# Patient Record
Sex: Male | Born: 1950 | Race: White | Hispanic: No | Marital: Married
Health system: Southern US, Community
[De-identification: ages and names within clinical notes are randomized; demographics above are authoritative.]

---

## 2008-12-29 ENCOUNTER — Ambulatory Visit: Payer: Self-pay | Admitting: Unknown Physician Specialty

## 2009-01-08 ENCOUNTER — Inpatient Hospital Stay: Payer: Self-pay | Admitting: Unknown Physician Specialty

## 2011-08-31 ENCOUNTER — Ambulatory Visit: Payer: Self-pay | Admitting: Family Medicine

## 2011-09-19 ENCOUNTER — Ambulatory Visit: Payer: Self-pay | Admitting: Oncology

## 2011-09-23 ENCOUNTER — Ambulatory Visit: Payer: Self-pay | Admitting: Oncology

## 2011-09-26 ENCOUNTER — Ambulatory Visit: Payer: Self-pay | Admitting: Oncology

## 2011-10-27 ENCOUNTER — Ambulatory Visit: Payer: Self-pay | Admitting: Oncology

## 2011-10-31 ENCOUNTER — Ambulatory Visit: Payer: Self-pay | Admitting: Unknown Physician Specialty

## 2011-11-11 ENCOUNTER — Other Ambulatory Visit: Payer: Self-pay | Admitting: Gastroenterology

## 2011-11-11 ENCOUNTER — Other Ambulatory Visit: Payer: Self-pay | Admitting: Unknown Physician Specialty

## 2011-11-11 DIAGNOSIS — D500 Iron deficiency anemia secondary to blood loss (chronic): Secondary | ICD-10-CM

## 2011-11-11 DIAGNOSIS — K5730 Diverticulosis of large intestine without perforation or abscess without bleeding: Secondary | ICD-10-CM

## 2011-11-23 ENCOUNTER — Other Ambulatory Visit: Payer: Self-pay

## 2011-11-23 ENCOUNTER — Ambulatory Visit: Payer: Self-pay | Admitting: Oncology

## 2011-11-26 ENCOUNTER — Ambulatory Visit: Payer: Self-pay | Admitting: Oncology

## 2011-11-30 ENCOUNTER — Ambulatory Visit: Payer: Self-pay | Admitting: Neurology

## 2011-12-08 ENCOUNTER — Ambulatory Visit
Admission: RE | Admit: 2011-12-08 | Discharge: 2011-12-08 | Disposition: A | Discharge status: Home / Self Care | Payer: Medicare Other | Source: Ambulatory Visit | Attending: Gastroenterology | Admitting: Gastroenterology

## 2011-12-08 DIAGNOSIS — D500 Iron deficiency anemia secondary to blood loss (chronic): Secondary | ICD-10-CM

## 2011-12-08 DIAGNOSIS — K5730 Diverticulosis of large intestine without perforation or abscess without bleeding: Secondary | ICD-10-CM

## 2011-12-27 ENCOUNTER — Ambulatory Visit: Payer: Self-pay | Admitting: Oncology

## 2012-02-29 ENCOUNTER — Ambulatory Visit: Payer: Self-pay | Admitting: Oncology

## 2012-02-29 LAB — CBC CANCER CENTER
Basophil #: 0.1 x10 3/mm (ref 0.0–0.1)
Lymphocyte %: 49.3 %
MCH: 35.1 pg — ABNORMAL HIGH (ref 26.0–34.0)
MCHC: 33.8 g/dL (ref 32.0–36.0)
MCV: 103.8 fL — ABNORMAL HIGH (ref 80–100)
Monocyte %: 13.3 %
Neutrophil #: 1.8 x10 3/mm (ref 1.4–6.5)
Neutrophil %: 32.5 %
Platelet: 196 x10 3/mm (ref 150–440)
RBC: 4.47 10*6/uL (ref 4.40–5.90)

## 2012-02-29 LAB — FERRITIN: Ferritin (ARMC): 58 ng/mL (ref 8–388)

## 2012-02-29 LAB — IRON AND TIBC
Iron Bind.Cap.(Total): 467 ug/dL — ABNORMAL HIGH (ref 250–450)
Iron Saturation: 22 %
Unbound Iron-Bind.Cap.: 363 ug/dL

## 2012-03-26 ENCOUNTER — Ambulatory Visit: Payer: Self-pay | Admitting: Oncology

## 2012-04-23 ENCOUNTER — Ambulatory Visit: Payer: Self-pay | Admitting: Otolaryngology

## 2012-04-23 LAB — CREATININE, SERUM
Creatinine: 0.78 mg/dL (ref 0.60–1.30)
EGFR (African American): >60

## 2012-04-25 ENCOUNTER — Ambulatory Visit: Payer: Self-pay | Admitting: Oncology

## 2012-04-26 ENCOUNTER — Ambulatory Visit: Payer: Self-pay | Admitting: Otolaryngology

## 2012-04-26 LAB — BASIC METABOLIC PANEL
BUN: 9 mg/dL (ref 7–18)
Calcium, Total: 8.4 mg/dL — ABNORMAL LOW (ref 8.5–10.1)
Chloride: 98 mmol/L (ref 98–107)
Co2: 25 mmol/L (ref 21–32)
Creatinine: 0.66 mg/dL (ref 0.60–1.30)
EGFR (African American): >60
Glucose: 100 mg/dL — ABNORMAL HIGH (ref 65–99)
Osmolality: 256 (ref 275–301)
Potassium: 4.4 mmol/L (ref 3.5–5.1)
Sodium: 128 mmol/L — ABNORMAL LOW (ref 136–145)

## 2012-04-26 LAB — CBC
HCT: 46 % (ref 40.0–52.0)
HGB: 15.9 g/dL (ref 13.0–18.0)
MCH: 34.3 pg — ABNORMAL HIGH (ref 26.0–34.0)
MCHC: 34.6 g/dL (ref 32.0–36.0)
RDW: 13.5 % (ref 11.5–14.5)
WBC: 8.2 10*3/uL (ref 3.8–10.6)

## 2012-04-30 ENCOUNTER — Ambulatory Visit: Payer: Self-pay | Admitting: Otolaryngology

## 2012-05-03 LAB — PATHOLOGY REPORT

## 2012-05-26 ENCOUNTER — Ambulatory Visit: Payer: Self-pay | Admitting: Oncology

## 2012-06-03 ENCOUNTER — Emergency Department: Payer: Self-pay | Admitting: Emergency Medicine

## 2012-09-05 ENCOUNTER — Ambulatory Visit: Payer: Self-pay | Admitting: Oncology

## 2012-09-05 LAB — CBC CANCER CENTER
Basophil #: 0 x10 3/mm (ref 0.0–0.1)
Eosinophil %: 5.7 %
HGB: 14.9 g/dL (ref 13.0–18.0)
Lymphocyte %: 45.3 %
Monocyte %: 12.5 %
Neutrophil %: 35.9 %
RBC: 4.39 10*6/uL — ABNORMAL LOW (ref 4.40–5.90)
WBC: 6.9 x10 3/mm (ref 3.8–10.6)

## 2012-09-05 LAB — IRON AND TIBC
Iron Bind.Cap.(Total): 464 ug/dL — ABNORMAL HIGH (ref 250–450)
Unbound Iron-Bind.Cap.: 342 ug/dL

## 2012-09-05 LAB — FERRITIN: Ferritin (ARMC): 47 ng/mL (ref 8–388)

## 2012-09-12 LAB — CBC CANCER CENTER
Basophil #: 0 x10 3/mm (ref 0.0–0.1)
Eosinophil #: 0.4 x10 3/mm (ref 0.0–0.7)
HGB: 14.2 g/dL (ref 13.0–18.0)
Lymphocyte %: 42.8 %
MCH: 34.1 pg — ABNORMAL HIGH (ref 26.0–34.0)
MCHC: 34.4 g/dL (ref 32.0–36.0)
MCV: 99 fL (ref 80–100)
Monocyte #: 0.7 x10 3/mm (ref 0.2–1.0)
Neutrophil %: 36 %
Platelet: 237 x10 3/mm (ref 150–440)
RBC: 4.16 10*6/uL — ABNORMAL LOW (ref 4.40–5.90)
RDW: 13.4 % (ref 11.5–14.5)
WBC: 5.3 x10 3/mm (ref 3.8–10.6)

## 2012-09-12 LAB — IRON AND TIBC
Iron Bind.Cap.(Total): 442 ug/dL (ref 250–450)
Iron Saturation: 17 %
Iron: 77 ug/dL (ref 65–175)
Unbound Iron-Bind.Cap.: 365 ug/dL

## 2012-09-12 LAB — FERRITIN: Ferritin (ARMC): 40 ng/mL (ref 8–388)

## 2012-09-25 ENCOUNTER — Ambulatory Visit: Payer: Self-pay | Admitting: Oncology

## 2013-01-08 IMAGING — CT CT NECK WITH CONTRAST
2 series · 10 of 14 positions shown, 12 images · non-contrast
Comparison: none

REASON FOR EXAM: [HOSPITAL] LABS in [HOSPITAL] Confirmed squamous cell carcinoma in
anterior right Jilani Justin...
COMMENTS:

[Series 2: soft tissue · axial · 0.51mm/px · z∈[-251,-14]mm · 8 of 103 slices shown, 10 images]
[im 12/103  soft-tissue]
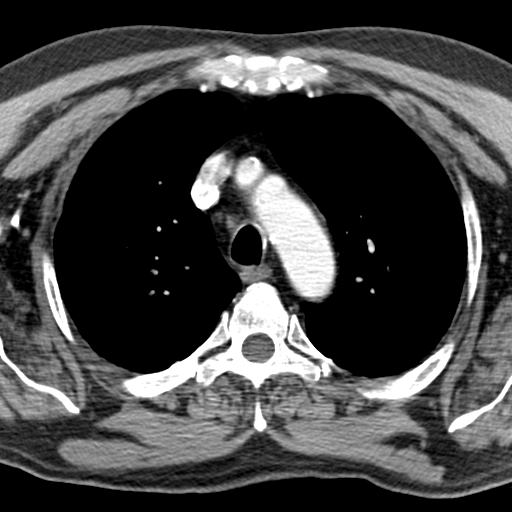
[im 12/103  bone]
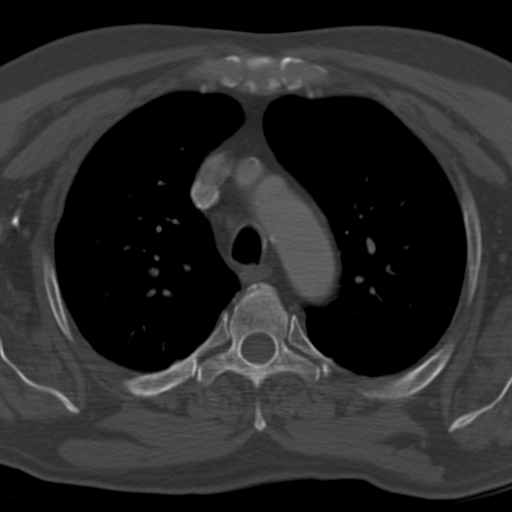
[im 23/103  bone]
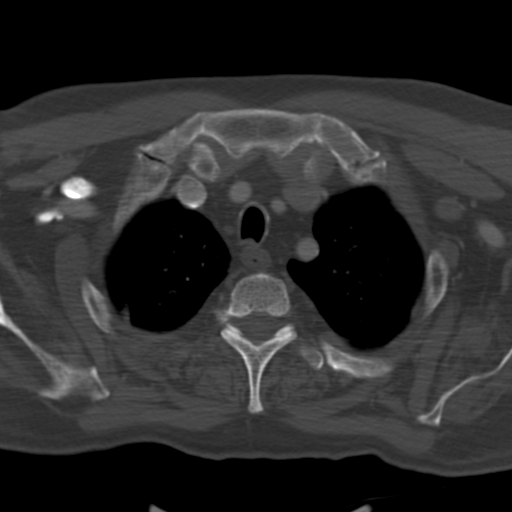
[im 35/103  bone]
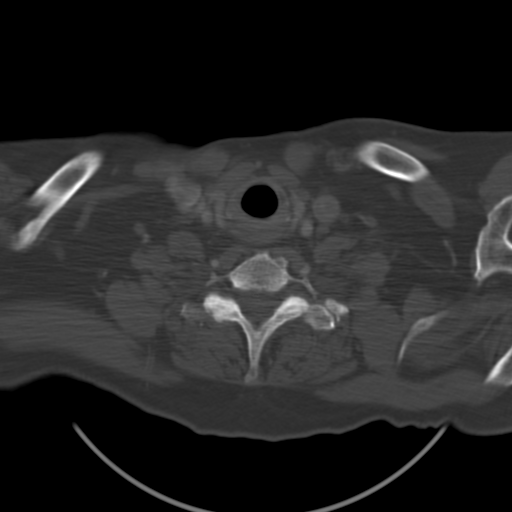
[im 46/103  bone]
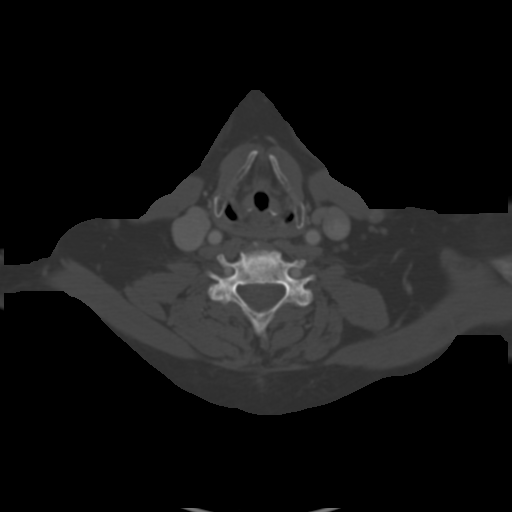
[im 57/103  soft-tissue]
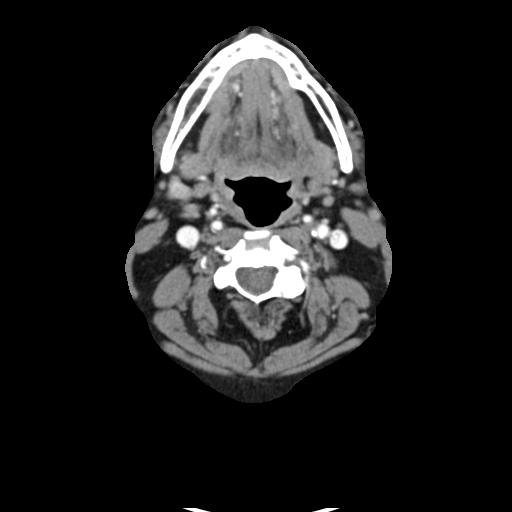
[im 57/103  bone]
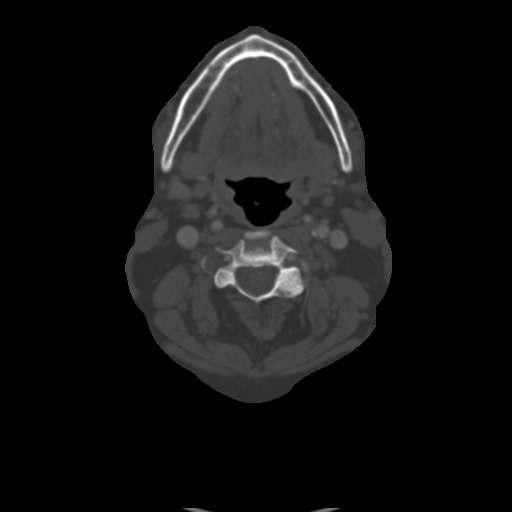
[im 69/103  bone]
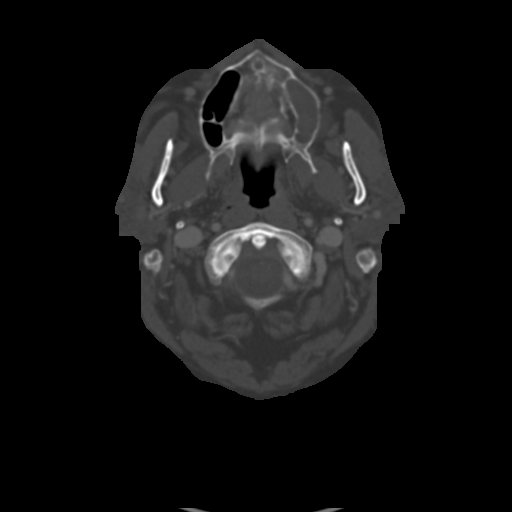
[im 80/103  bone]
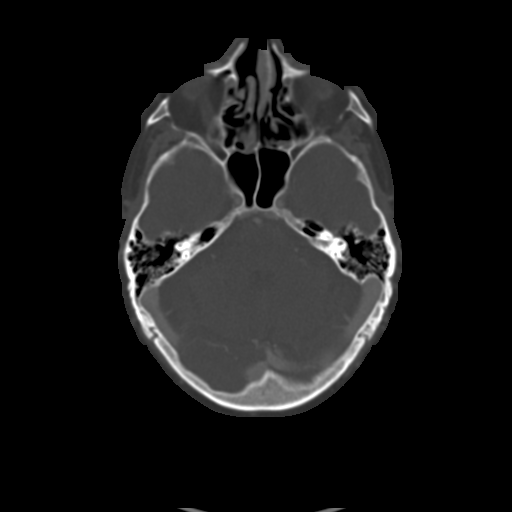
[im 91/103  bone]
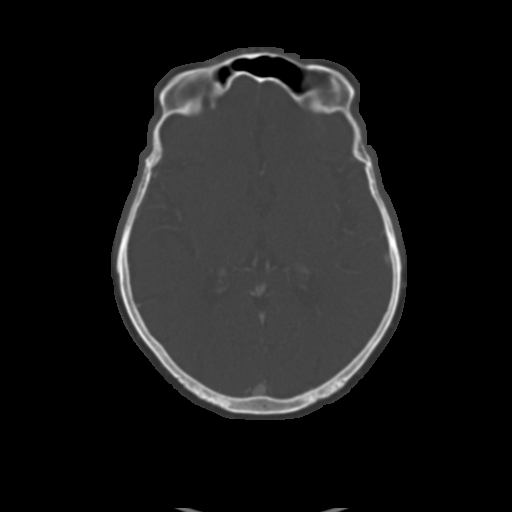

[Series 3: lung · axial · 0.57mm/px · z∈[-251,-215]mm · 2 of 36 slices shown]
[im 12/36  bone]
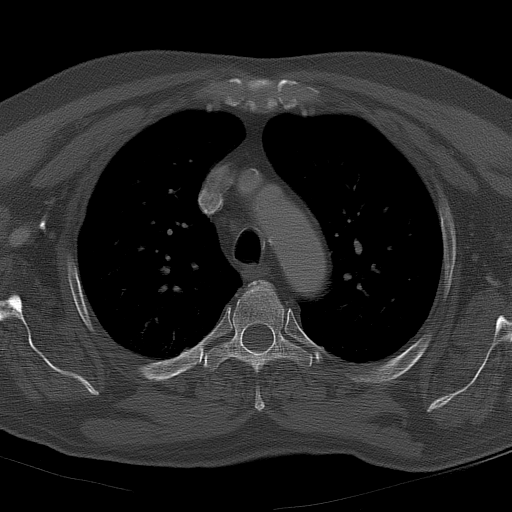
[im 24/36  bone]
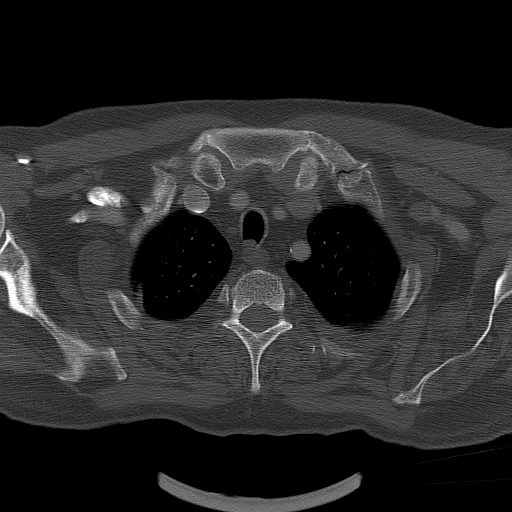

[10 of 14 positions shown; findings below may reference images not displayed]

PROCEDURE:     TEICHROEB - TEICHROEB NECK WITH CONTRAST  - April 23, 2012  [DATE]

RESULT:     Axial CT scanning was performed through the neck with
reconstructions at 3 mm intervals and slice thicknesses. The patient
received 75 cc of Ssovue-GEE. Review of multiplanar reconstructed images was
performed separately on the VIA monitor.

The structures of the floor of the mouth are grossly normal. I see no masses
within the substance of the tongue. The submandibular glands appear normal
in density and contour and symmetric in size. The parotid glands also of
normal in appearance. I do not see bulky cervical lymph nodes. There is soft
tissue density material in the inferior aspect of the left maxillary sinus.
The nasal septum is deviated toward the left. The nasal passages appear
patent. The tonsils and adenoids are normal. The prevertebral soft tissue
spaces are normal in appearance. The laryngeal structures exhibit no acute
abnormality. The thyroid lobes are small but symmetric from right to left.
The jugular and carotid vessels are normally positioned. I do not see
pathologic sized cervical lymph nodes. The cervical vertebral bodies are
preserved in height. There is loss of the normal cervical lordosis. There is
disc space narrowing at C5-C6 and C6-C7.
IMPRESSION: 1. I do not see CT evidence of abnormalities of the floor of the mouth.
Further evaluation with PET/CT scanning may be the most useful next step.
2. I do not see bulky anterior or posterior cervical lymphadenopathy.
3. The submandibular glands exhibit no acute abnormality.
4. There is soft tissue density in the inferior aspect of the left maxillary
sinus which is likely chronic.
5. There are degenerative changes of the mid cervical discs.

Dictation location one

## 2013-03-19 ENCOUNTER — Ambulatory Visit: Payer: Self-pay | Admitting: Otolaryngology

## 2013-03-19 LAB — CBC WITH DIFFERENTIAL/PLATELET
Eosinophil %: 3.4 %
HCT: 40.6 % (ref 40.0–52.0)
HGB: 13.3 g/dL (ref 13.0–18.0)
Lymphocyte #: 3.4 10*3/uL (ref 1.0–3.6)
Lymphocyte %: 30.2 %
MCH: 30.5 pg (ref 26.0–34.0)
MCHC: 32.8 g/dL (ref 32.0–36.0)
MCV: 93 fL (ref 80–100)
Monocyte #: 1.5 x10 3/mm — ABNORMAL HIGH (ref 0.2–1.0)
Monocyte %: 13.5 %
Neutrophil #: 5.9 10*3/uL (ref 1.4–6.5)
Neutrophil %: 51.5 %
Platelet: 251 10*3/uL (ref 150–440)
RBC: 4.37 10*6/uL — ABNORMAL LOW (ref 4.40–5.90)
RDW: 16.2 % — ABNORMAL HIGH (ref 11.5–14.5)

## 2013-03-19 LAB — BASIC METABOLIC PANEL
BUN: 6 mg/dL — ABNORMAL LOW (ref 7–18)
Calcium, Total: 8.5 mg/dL (ref 8.5–10.1)
Chloride: 96 mmol/L — ABNORMAL LOW (ref 98–107)
Co2: 23 mmol/L (ref 21–32)
EGFR (Non-African Amer.): >60
Osmolality: 259 (ref 275–301)
Potassium: 4 mmol/L (ref 3.5–5.1)

## 2013-03-19 LAB — TSH: Thyroid Stimulating Horm: 4.68 u[IU]/mL — ABNORMAL HIGH

## 2013-07-30 ENCOUNTER — Ambulatory Visit: Payer: Self-pay | Admitting: Otolaryngology

## 2013-07-30 LAB — TSH: Thyroid Stimulating Horm: 3.37 u[IU]/mL

## 2013-11-27 ENCOUNTER — Ambulatory Visit: Payer: Self-pay | Admitting: Otolaryngology

## 2013-11-27 LAB — T4, FREE: Free Thyroxine: 1.07 ng/dL (ref 0.76–1.46)

## 2014-02-25 ENCOUNTER — Ambulatory Visit: Payer: Self-pay | Admitting: Otolaryngology

## 2014-02-25 LAB — T4, FREE: Free Thyroxine: 1.07 ng/dL (ref 0.76–1.46)

## 2014-02-25 LAB — TSH: Thyroid Stimulating Horm: 1.87 u[IU]/mL

## 2014-09-09 ENCOUNTER — Ambulatory Visit: Payer: Self-pay | Admitting: Otolaryngology

## 2014-09-09 LAB — T4, FREE: FREE THYROXINE: 0.95 ng/dL (ref 0.76–1.46)

## 2014-09-09 LAB — TSH: Thyroid Stimulating Horm: 1.73 u[IU]/mL

## 2014-09-12 ENCOUNTER — Emergency Department: Payer: Self-pay | Admitting: Emergency Medicine

## 2014-09-12 LAB — CBC
HCT: 22.4 % — AB (ref 40.0–52.0)
HGB: 7.2 g/dL — AB (ref 13.0–18.0)
MCH: 30.7 pg (ref 26.0–34.0)
MCHC: 31.9 g/dL — ABNORMAL LOW (ref 32.0–36.0)
MCV: 96 fL (ref 80–100)
Platelet: 117 10*3/uL — ABNORMAL LOW (ref 150–440)
RBC: 2.33 10*6/uL — ABNORMAL LOW (ref 4.40–5.90)
RDW: 18.1 % — AB (ref 11.5–14.5)
WBC: 9.3 10*3/uL (ref 3.8–10.6)

## 2014-09-12 LAB — LIPASE, BLOOD: LIPASE: 250 U/L (ref 73–393)

## 2014-09-12 LAB — BASIC METABOLIC PANEL
Anion Gap: 12 (ref 7–16)
BUN: 61 mg/dL — ABNORMAL HIGH (ref 7–18)
Calcium, Total: 7.4 mg/dL — ABNORMAL LOW (ref 8.5–10.1)
Chloride: 100 mmol/L (ref 98–107)
Co2: 16 mmol/L — ABNORMAL LOW (ref 21–32)
Creatinine: 3.63 mg/dL — ABNORMAL HIGH (ref 0.60–1.30)
EGFR (African American): 19 — ABNORMAL LOW
EGFR (Non-African Amer.): 17 — ABNORMAL LOW
Glucose: 105 mg/dL — ABNORMAL HIGH (ref 65–99)
Osmolality: 275 (ref 275–301)
Potassium: 3.8 mmol/L (ref 3.5–5.1)
Sodium: 128 mmol/L — ABNORMAL LOW (ref 136–145)

## 2014-09-12 LAB — COMPREHENSIVE METABOLIC PANEL
ALK PHOS: 158 U/L — AB
AST: 160 U/L — AB (ref 15–37)
Albumin: 0.7 g/dL — ABNORMAL LOW (ref 3.4–5.0)
Anion Gap: 3 — ABNORMAL LOW (ref 7–16)
BUN: 43 mg/dL — AB (ref 7–18)
Bilirubin,Total: 12.9 mg/dL — ABNORMAL HIGH (ref 0.2–1.0)
CHLORIDE: 120 mmol/L — AB (ref 98–107)
CO2: 12 mmol/L — AB (ref 21–32)
Calcium, Total: <5 mg/dL — CL (ref 8.5–10.1)
Creatinine: 2.27 mg/dL — ABNORMAL HIGH (ref 0.60–1.30)
GFR CALC AF AMER: 34 — AB
GFR CALC NON AF AMER: 29 — AB
GLUCOSE: 67 mg/dL (ref 65–99)
OSMOLALITY: 279 (ref 275–301)
Potassium: 2.3 mmol/L — CL (ref 3.5–5.1)
SGPT (ALT): 60 U/L
SODIUM: 135 mmol/L — AB (ref 136–145)
TOTAL PROTEIN: 4.4 g/dL — AB (ref 6.4–8.2)

## 2014-09-12 LAB — PROTIME-INR
INR: 4.2
INR: 2.2
PROTHROMBIN TIME: 39.3 s — AB (ref 11.5–14.7)
PROTHROMBIN TIME: 23.6 s — AB (ref 11.5–14.7)

## 2014-09-12 LAB — HEMOGLOBIN: HGB: 8.9 g/dL — AB (ref 13.0–18.0)

## 2014-09-12 LAB — TROPONIN I: TROPONIN-I: 0.02 ng/mL

## 2014-09-12 LAB — BILIRUBIN, DIRECT: Bilirubin, Direct: 10.2 mg/dL — ABNORMAL HIGH (ref 0.00–0.20)

## 2014-09-25 DEATH — deceased

## 2015-04-19 NOTE — Op Note (Signed)
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    OPERATIVE PROCEDURES:  1. Excision squamous cell carcinoma right anterior floor of mouth with frozen section margins.  2. Right suprahyoid modified neck dissection.  SURGEON: Cammy CopaPaul H. Shabazz Mckey, MD  ASSISTANT: Bud Facereighton Vaught, M.D.   ANESTHESIA: General.  COMPLICATIONS: None.   TOTAL ESTIMATED BLOOD LOSS: 15 mL.   DESCRIPTION OF PROCEDURE: Patient was given general anesthesia by nasotracheal intubation through the left nostril to free up the oropharynx. The oropharynx was visualized and area was marked around the lesion in the anterior floor of mouth. The skin was marked on the anterior neck for making incision for the modified neck dissection. The skin was infiltrated with 6 mL of 1% Xylocaine with epinephrine 1:100,000 for vasoconstriction. The right anterior floor of mouth, base of tongue was infiltrated also for further vasoconstriction. He was prepped and draped in sterile fashion.   A Davis mouth gag was used for visualizing the oropharynx. A wet vag pack was placed into the posterior pharynx because there was a small leak from the nasotracheal tube. The right floor of mouth was visualized and an incision was made sharply around the lesion leaving a couple of millimeters margin all the way around this. Before I removed it I took a separate sliver of mucosa posteriorly and anteriorly to this area and sent that for frozen sections to make sure that the margins were clear. The lesion was then freed up using electrocautery. The entire Warthin's duct on the right side was freed up and removed. Lingual nerve was found underneath this as I tracked down the floor of mouth. The entire sublingual gland was freed up  and removed as well. Once this was freed all the way around it was left sitting here and attention was directed inferiorly.   Incision was made in the neck a couple of fingerbreadths below the mandibular margin. This was carried through skin and subcutaneous down to the level of platysma and platysma was then divided being careful not to catch the marginal mandibular branch of the facial nerve. Dissection was carried down to the bottom of the submandibular gland and it went up underneath it until I got behind the tendon of the digastric. The submandibular gland was quite large. Once I dissected all the way around it, I started dissecting anterior to it into the mental area. There were a couple of lymph nodes there and in the submandibular triangle. These were freed up and separated from the underlying muscular tissue. The marginal mandibular branch was found and freed up to slide upward and the remaining fat and some lymph nodes removed from the submandibular triangle anteriorly. This left the gland and fatty tissue with lymph nodes attached to it attached to the submandibular duct. Then using bimanual palpation as well as bimanual visualization the rest of the nerve was freed and the cancer was delivered out the neck. There is a Y to the lingual nerve and one branch of the Y was cut as it was attached to some of the tissues attached to the cancer. This was sutured back together using a 4-0 Vicryl. There was no significant bleeding at the anterior floor of mouth and its margins were clear by frozen section. No further nodes noted in  the submandibular triangle and this was completely removed. It was copiously irrigated. No further bleeding was found. The oropharyngeal portion of the lesion was closed first lateralized in the undersurface of the tongue by the wall using 4-0 Vicryl in an interrupted fashion for closure of this area. A 10 French TLS drain was placed inferiorly. The bottom was copiously irrigated  again. There were some deep stitches that were placed to help close the dead space and then the platysmal layer was closed using 4-0 Vicryl followed by closure of the dermis using 4-0 Vicryl as was well. 5-0 nylon suture was used for closure of the skin edge. Patient tolerated procedure well and was taken to the recovery room in satisfactory condition. The wound had been covered with bacitracin, Telfa and Tegaderm. The drain was placed to low continuous suction by Vacutainer. There were no operative complications.  ____________________________ Cammy Copa, MD phj:cms D: 04/30/2012 22:59:21 ET T: 05/01/2012 10:37:49 ET JOB#: [id number]  cc: Cammy Copa, MD, <Dictator> Cammy Copa MD ELECTRONICALLY SIGNED 05/03/2012 12:30

## 2015-04-19 NOTE — Consult Note (Signed)
Reason for Visit: This 64 year old Male patient presents to the clinic for initial evaluation of  Squamous carcinoma the floor the mouth .   Referred by Dr. Genevive Bi.  Diagnosis:   Chief Complaint/Diagnosis   probable T1, N0, M0 stage I squamous cell carcinoma the floor of the mouth in 64 year old male   Pathology Report Pathology report reviewed    Imaging Report CT scan of head and neck reviewed    Referral Report Clinical notes reviewed    Planned Treatment Regimen Surgical excision    HPI   patient is a pleasant 64 year old male followed in the mid in clinic for iron deficiency anemia by Dr. Orlie Dakin. He is been followed by Dr. 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 He had a CT scan which shows no evidence of adenopathy in the floor of the mouth or neck. He is having no head and neck pain and no dysphagia. Dr. Genevive Bi would like to surgical remove this lesion and has asked me to evaluate the patient for my opinion.  Past Hx:    Arthritis:    L inguinal hernia:    Hypertension:    Left humeral fracture with graft and plate:    Neck Fusion: 1990  Past, Family and Social History:   Past Medical History positive    Cardiovascular hypertension    Past Surgical History Neck fusion, left humeral fracture with graft and plate left knee replacement    Past Medical History Comments Left inguinal hernia, arthritis, history of syncope    Family History positive    Family History Comments Mother with brain cancer    Social History positive    Social History Comments Greater than 50 pack year smoking history social EtOH use history    Additional Past Medical and Surgical History Accompanied by his wife today   Allergies:   Codeine: Rash  Home Meds:  Home  Medications: Medication Instructions Status  tylenol 650-1000mg  every 4 hours if needed  Active  Advil 200 mg oral tablet 2 tab(s) orally once a day, As Needed for headaches Active  losartan 50 mg oral tablet 1 tab(s) orally once a day Active  metoprolol succinate 100 mg oral tablet, extended release 1 tab(s) orally once a day Active  primidone 50 mg oral tablet 1 tab(s) orally 2 times a day Active  Slow Release Iron 1 tab(s) orally once a day Active  omeprazole 40 mg oral delayed release capsule 1 cap(s) orally once a day Active   Review of Systems:   General negative    Performance Status (ECOG) 0    Skin negative    Breast negative    Ophthalmologic negative    ENMT see HPI    Respiratory and Thorax negative    Cardiovascular see HPI    Gastrointestinal negative    Genitourinary negative    Musculoskeletal negative    Neurological negative    Psychiatric negative    Hematology/Lymphatics negative    Endocrine negative    Allergic/Immunologic negative   Nursing Notes:  Nursing Vital Signs and Chemo Nursing Nursing Notes: *CC Vital Signs Flowsheet:   01-May-13 10:44   Temp Temperature 98.4   Pulse Pulse 67   Respirations Respirations 20   SBP SBP 137   Pain Scale (0-10)  1  Current Weight (kg) (kg) 86.1   Height (cm) centimeters 166   BSA (m2) 1.9   Physical Exam:  General/Skin/HEENT:   General normal    Skin normal    Eyes normal    Additional PE Well-developed slightly obese male in NAD. Oral cavity shows small nodular lesion right of midline in the floor of the mouth. Remainder of oral cavity is clear. Indirect mirror examination shows cords approximating well vallecula and base of tongue within normal limits. Neck is clear without evidence of some digastric cervical or supraclavicular adenopathy. Lungs are clear to A&P cardiac examination shows regular rate and rhythm.   Breasts/Resp/CV/GI/GU:   Respiratory and Thorax normal     Cardiovascular normal    Gastrointestinal normal    Genitourinary normal   MS/Neuro/Psych/Lymph:   Musculoskeletal normal    Neurological normal    Lymphatics normal   Other Results:  Radiology Results: CT:    29-Apr-13 14:03, CT Neck With Contrast   CT Neck With Contrast    REASON FOR EXAM:    [facility] in [place] Confirmed squamous cell carcinoma   in anterior right floo...  COMMENTS:       PROCEDURE: MCT - MCT NECK WITH CONTRAST  - Apr 23 2012  2:03PM     RESULT: Axial CT scanning was performed through the neck with   reconstructions at 3 mm intervals and slice thicknesses. The patient   received 75 cc of Isovue-300. Review of multiplanar reconstructed images   was performed separately on the VIA monitor.    The structures of the floor of the mouth are grossly normal. I see no   masses within the substance of the tongue. The submandibular glands   appear normal in density and contour and symmetric in size. The parotid   glands also of normal in appearance. I do not see bulky cervical lymph     nodes. There is soft tissue density material in the inferior aspect of   the left maxillary sinus. The nasal septum is deviated toward the left.   The nasal passages appear patent. The tonsils and adenoids are normal.   The prevertebral soft tissue spaces are normal in appearance. The   laryngeal structures exhibit no acute abnormality. The thyroid lobes are   small but symmetric from right to left. The jugular and carotid vessels   are normally positioned. I do not see pathologic sized cervical lymph   nodes. Thecervical vertebral bodies are preserved in height. There is   loss of the normal cervical lordosis. There is disc space narrowing at   C5-C6 and C6-C7.    IMPRESSION:   1. I do not see CT evidence of abnormalities of the floor of the mouth.   Furtherevaluation with PET/CT scanning may be the most useful next step.  2. I do not see bulky anterior or posterior cervical  lymphadenopathy.  3. The submandibular glands exhibit no acute abnormality.  4. There is soft tissue density in the inferior aspect of the left   maxillary sinus which is likely chronic.  5. There are degenerative changes of the mid cervical discs.    Dictation location one          Verified By: DAVID A. [place], M.D., MD   Assessment and Plan:  Impression:   probable stage I squamous carcinoma the floor the mouth in 64 year old male  Plan:   at this time I have reviewed pathology clinical notes and CT scan and agree with Dr. Genevive Bi  surgical excision would be the best avenue of treatment. I would reserve radiation therapy should margins be positive order developed a recurrence in the future. All this was explained to the patient and his wife. They are seeing Dr. Genevive BiJuengle this afternoon for surgical consultation. I have personally discuss the case with Dr. Genevive BiJuengle and agreement on the above treatment plan.  I would like to take this opportunity to thank you for allowing me to continue to participate in this patient's care.  CC Referral:   cc: Dr. Genevive BiJuengle, Dr.Feldpausch   Electronic Signatures: Rushie Chestnuthrystal, Gordy CouncilmanGlenn S (MD)  (Signed 01-May-13 11:35)  Authored: HPI, Diagnosis, Past Hx, PFSH, Allergies, Home Meds, ROS, Nursing Notes, Physical Exam, Other Results, Encounter Assessment and Plan, CC Referring Physician   Last Updated: 01-May-13 11:35 by Rebeca Alerthrystal, Autrey Human S (MD)

## 2015-05-30 IMAGING — US ABDOMEN ULTRASOUND
1 series · 13 of 25 positions shown · non-contrast
Comparison: None.

CLINICAL DATA: Painless jaundice of uncertain etiology.

EXAM:
ULTRASOUND ABDOMEN COMPLETE

[Series 1: abdomen ultrasound · 0.40mm/px · 13 of 89 slices shown]
[im 1/89]
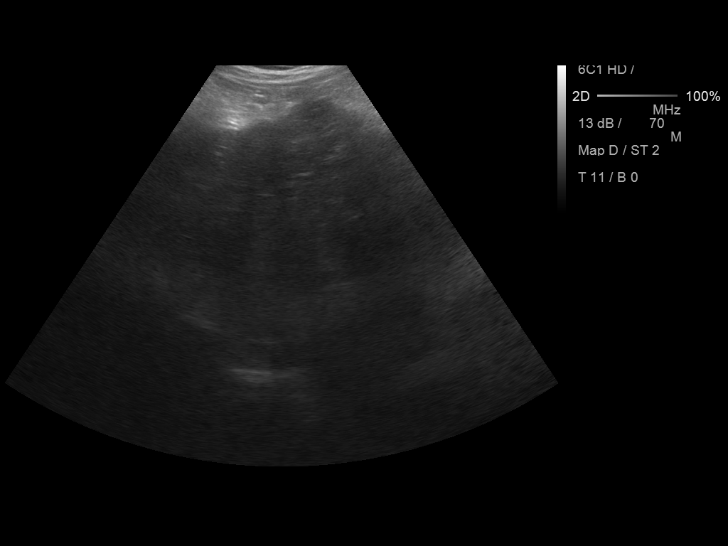
[im 8/89]
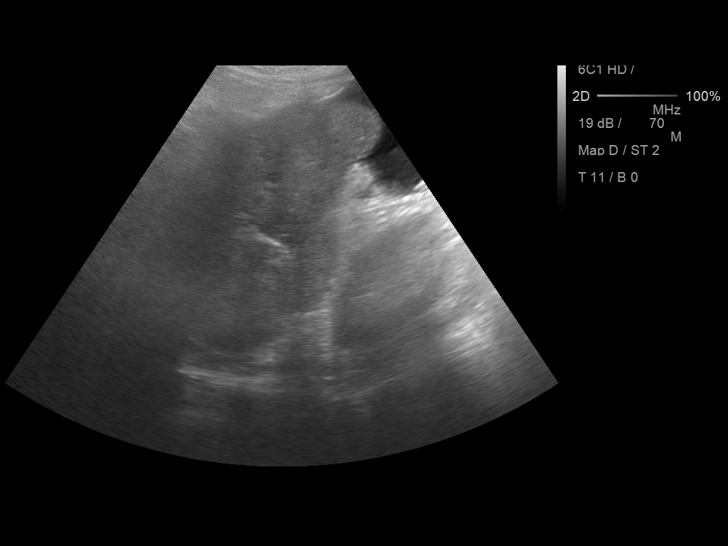
[im 15/89]
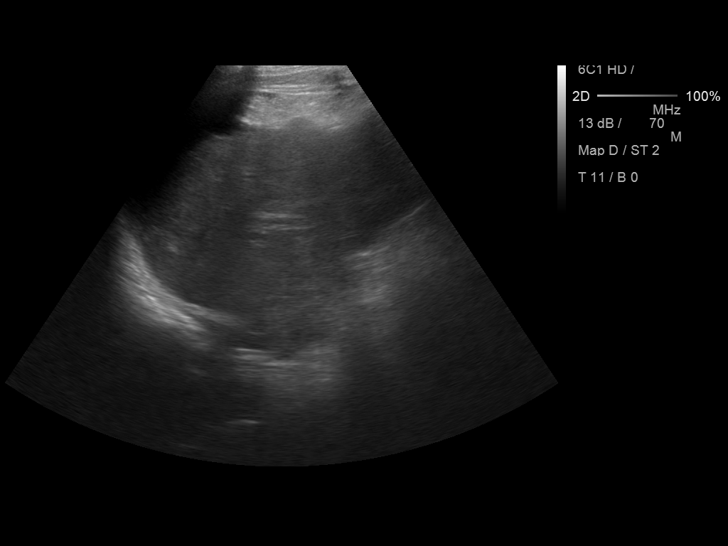
[im 23/89]
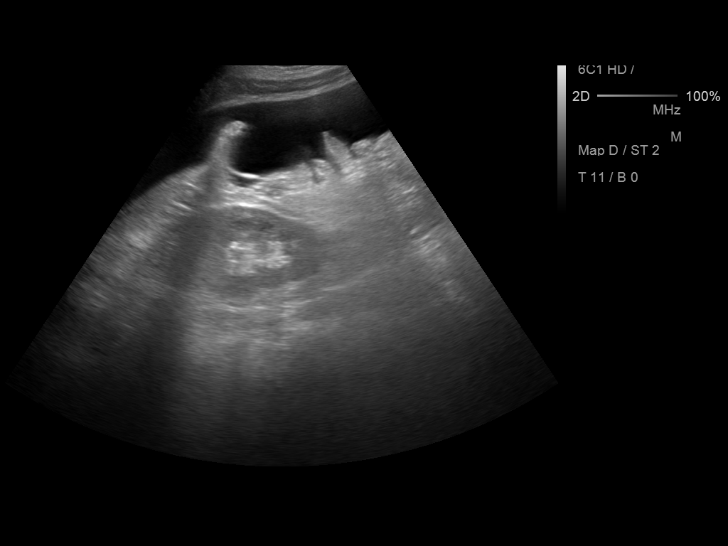
[im 30/89]
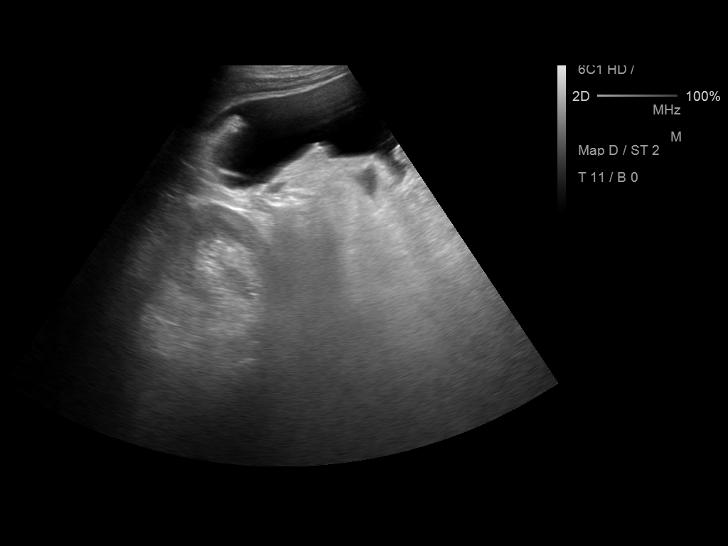
[im 37/89]
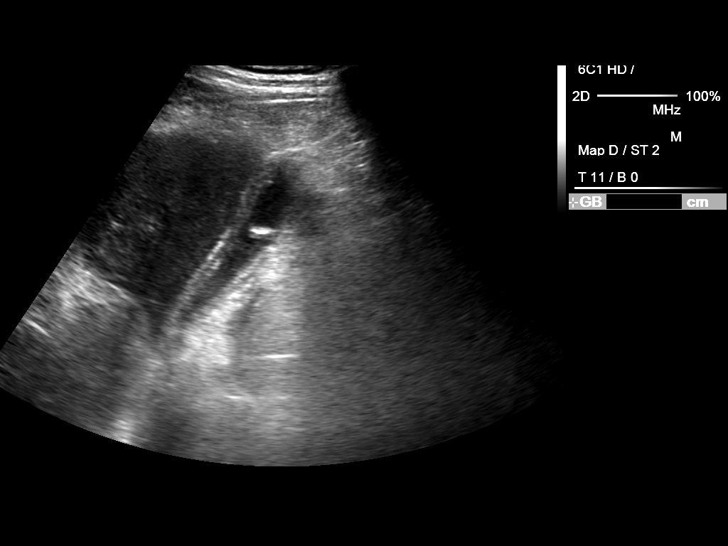
[im 45/89]
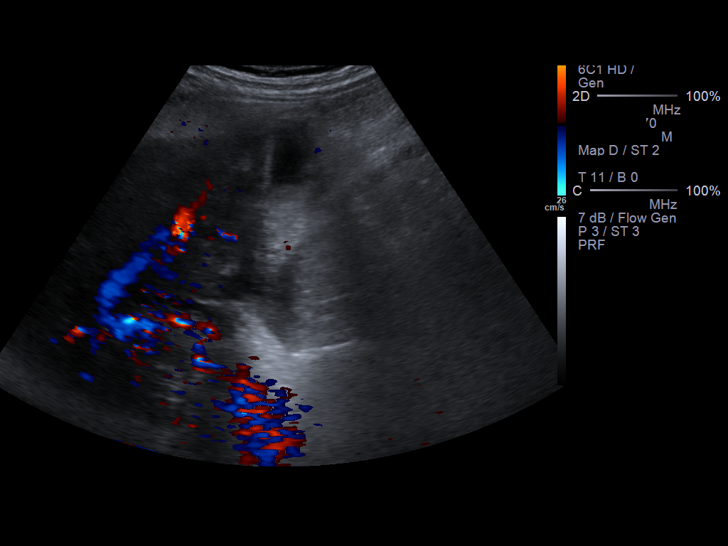
[im 52/89]
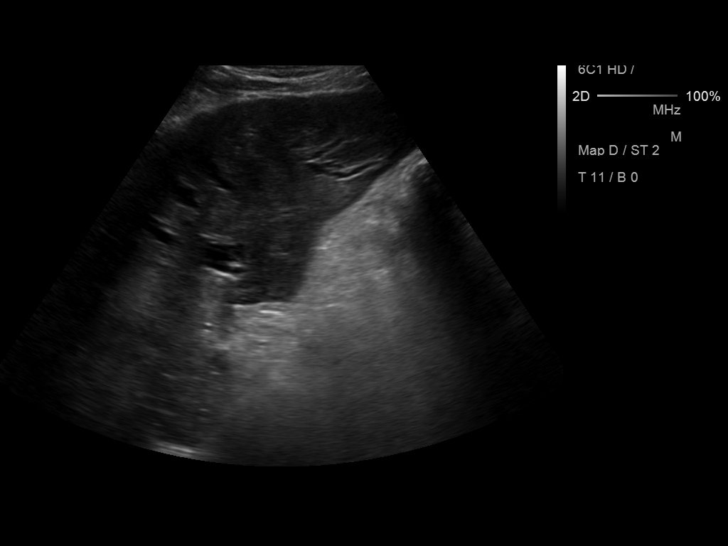
[im 59/89]
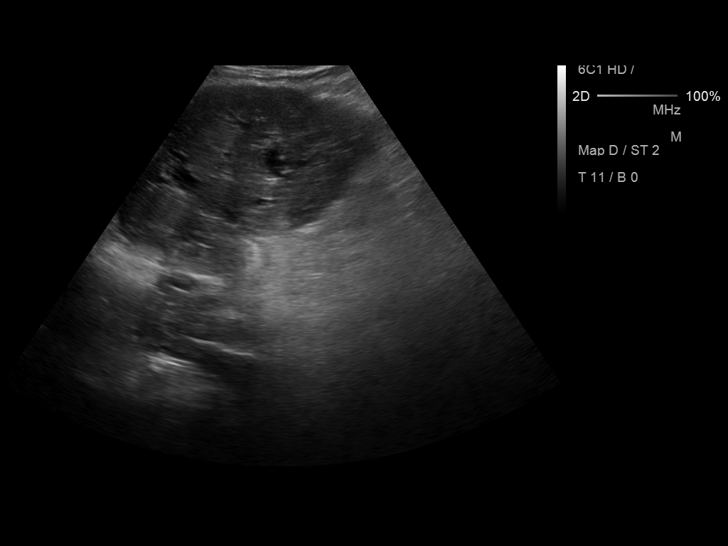
[im 67/89]
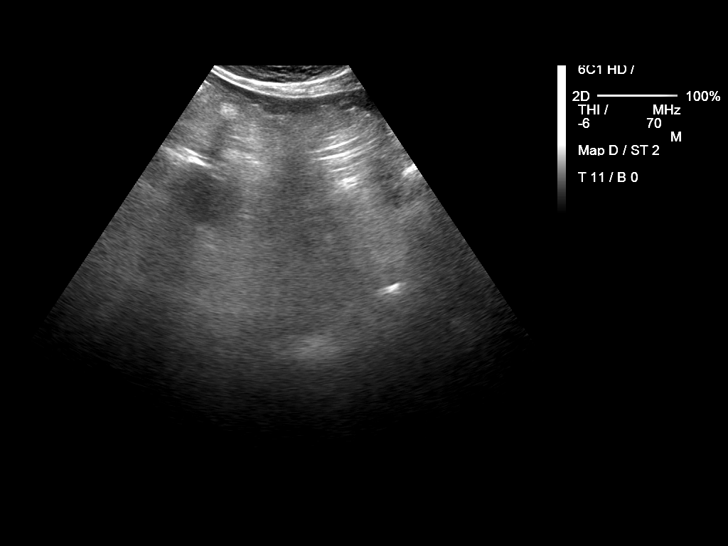
[im 74/89]
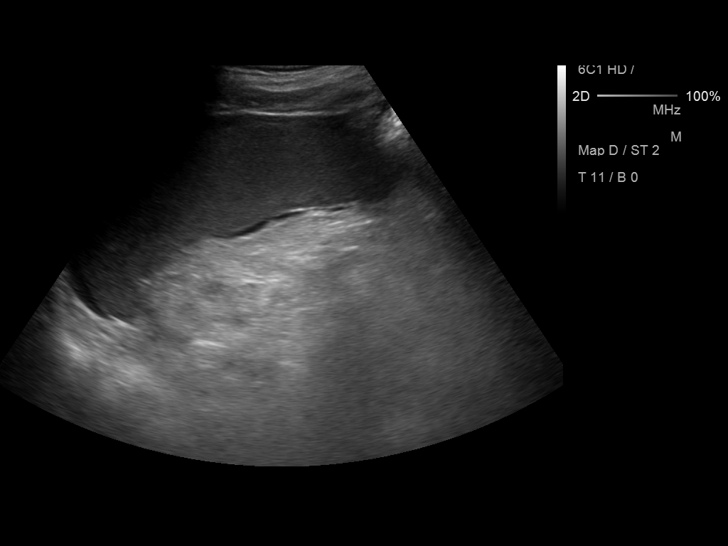
[im 81/89]
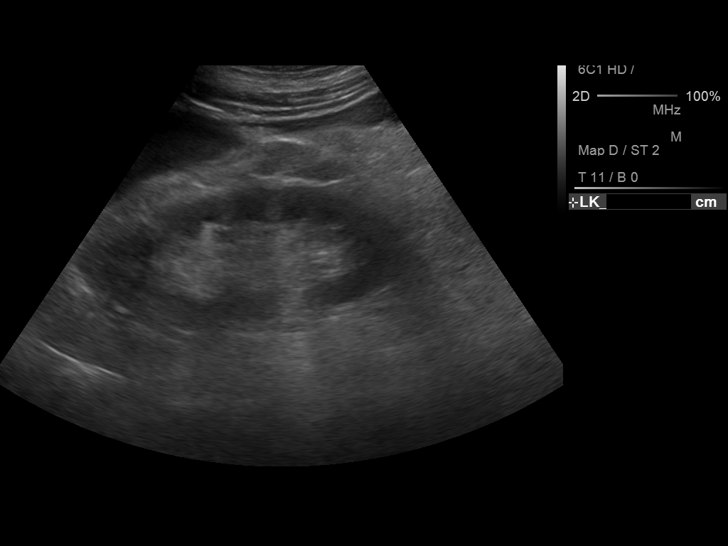
[im 89/89]
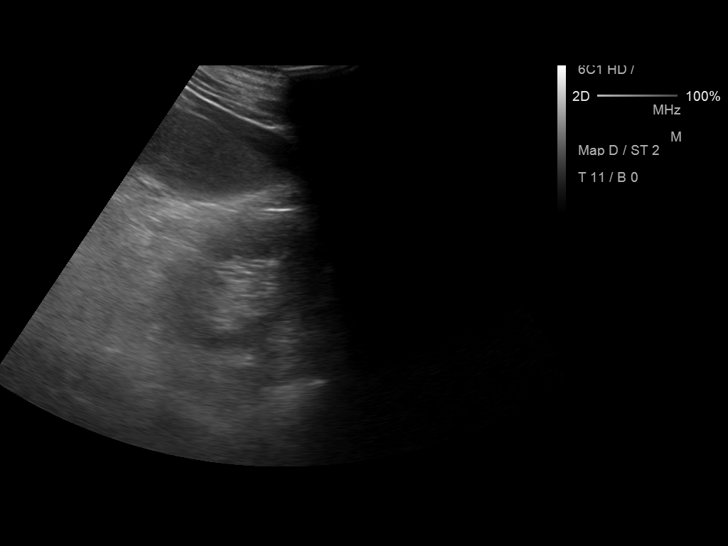

[13 of 25 positions shown; findings below may reference images not displayed]

FINDINGS: Gallbladder:

The gallbladder is contracted and exhibits wall thickening to 5 mm.
There is a 12 mm diameter shadowing stone. There is no positive
sonographic Murphy's sign.

Common bile duct:

Diameter: Abnormally dilated and 11.1 mm

Liver:

There is left-sided intrahepatic ductal dilation. No parenchymal
hepatic mass is demonstrated.

IVC:

No abnormality visualized.

Pancreas:

The pancreas was largely obscured by bowel gas and a mass cannot be
excluded.

Spleen:

Size and appearance within normal limits.

Right Kidney:

Length: 12.7 cm. Echogenicity within normal limits. No mass or
hydronephrosis visualized.

Left Kidney:

Length: 13.6 cm.. Echogenicity within normal limits. No mass or
hydronephrosis visualized.

Abdominal aorta:

No aneurysm visualized but visualization of the middle and distal
portions of the aorta is limited by bowel gas.

Other findings:

There is moderate ascites demonstrated throughout the abdomen.
IMPRESSION: 1. There is left-sided intrahepatic ductal dilation, dilation of the
common bile duct, and contraction of the gallbladder with a stone.
There is no sonographic evidence of acute cholecystitis. Evaluation
of the pancreas is quite limited.
2. There is no acute abnormality of the spleen or kidneys.
3. The abdominal aorta could not be fully assessed.
4. Further evaluation with contrast-enhanced CT scanning of the
abdomen and pelvis is recommended to exclude a pancreatic or bile
duct mass.
